# Patient Record
Sex: Female | Born: 1970 | Race: White | Hispanic: No | Marital: Married | Smoking: Never smoker
Health system: Southern US, Community
[De-identification: ages and names within clinical notes are randomized; demographics above are authoritative.]

## PROBLEM LIST (undated history)

## (undated) DIAGNOSIS — O24419 Gestational diabetes mellitus in pregnancy, unspecified control: Secondary | ICD-10-CM

## (undated) DIAGNOSIS — IMO0002 Reserved for concepts with insufficient information to code with codable children: Secondary | ICD-10-CM

## (undated) DIAGNOSIS — R87619 Unspecified abnormal cytological findings in specimens from cervix uteri: Secondary | ICD-10-CM

## (undated) HISTORY — DX: Reserved for concepts with insufficient information to code with codable children: IMO0002

## (undated) HISTORY — DX: Gestational diabetes mellitus in pregnancy, unspecified control: O24.419

## (undated) HISTORY — DX: Unspecified abnormal cytological findings in specimens from cervix uteri: R87.619

## (undated) HISTORY — PX: WISDOM TOOTH EXTRACTION: SHX21

---

## 1999-11-08 ENCOUNTER — Other Ambulatory Visit: Admission: RE | Admit: 1999-11-08 | Discharge: 1999-11-08 | Payer: Self-pay | Admitting: *Deleted

## 1999-12-05 ENCOUNTER — Other Ambulatory Visit: Admission: RE | Admit: 1999-12-05 | Discharge: 1999-12-05 | Payer: Self-pay | Admitting: *Deleted

## 2001-01-28 ENCOUNTER — Other Ambulatory Visit: Admission: RE | Admit: 2001-01-28 | Discharge: 2001-01-28 | Payer: Self-pay | Admitting: Obstetrics and Gynecology

## 2001-06-12 ENCOUNTER — Ambulatory Visit (HOSPITAL_COMMUNITY): Admission: RE | Admit: 2001-06-12 | Discharge: 2001-06-12 | Payer: Self-pay | Admitting: Obstetrics and Gynecology

## 2001-06-12 ENCOUNTER — Encounter: Payer: Self-pay | Admitting: Obstetrics and Gynecology

## 2001-09-11 ENCOUNTER — Inpatient Hospital Stay (HOSPITAL_COMMUNITY): Admission: AD | Admit: 2001-09-11 | Discharge: 2001-09-14 | Payer: Self-pay | Admitting: Obstetrics and Gynecology

## 2002-03-17 ENCOUNTER — Other Ambulatory Visit: Admission: RE | Admit: 2002-03-17 | Discharge: 2002-03-17 | Payer: Self-pay | Admitting: Obstetrics and Gynecology

## 2006-02-21 ENCOUNTER — Other Ambulatory Visit: Admission: RE | Admit: 2006-02-21 | Discharge: 2006-02-21 | Payer: Self-pay | Admitting: Obstetrics and Gynecology

## 2012-01-18 ENCOUNTER — Ambulatory Visit: Payer: Self-pay | Admitting: Obstetrics and Gynecology

## 2012-01-21 ENCOUNTER — Other Ambulatory Visit: Payer: Self-pay | Admitting: Obstetrics and Gynecology

## 2012-01-21 DIAGNOSIS — Z1231 Encounter for screening mammogram for malignant neoplasm of breast: Secondary | ICD-10-CM

## 2012-02-28 ENCOUNTER — Ambulatory Visit
Admission: RE | Admit: 2012-02-28 | Discharge: 2012-02-28 | Disposition: A | Discharge status: Home / Self Care | Payer: BC Managed Care – PPO | Source: Ambulatory Visit | Attending: Obstetrics and Gynecology | Admitting: Obstetrics and Gynecology

## 2012-02-28 DIAGNOSIS — Z1231 Encounter for screening mammogram for malignant neoplasm of breast: Secondary | ICD-10-CM

## 2012-03-05 ENCOUNTER — Encounter: Payer: Self-pay | Admitting: Obstetrics and Gynecology

## 2012-03-05 NOTE — Progress Notes (Signed)
Quick Note:  Please send "Dense breast" letter to patient and document in chart when letter is sent. ______ 

## 2012-03-13 ENCOUNTER — Encounter: Payer: Self-pay | Admitting: Obstetrics and Gynecology

## 2012-03-13 ENCOUNTER — Ambulatory Visit (INDEPENDENT_AMBULATORY_CARE_PROVIDER_SITE_OTHER): Payer: BC Managed Care – PPO | Admitting: Obstetrics and Gynecology

## 2012-03-13 VITALS — BP 112/70 | Ht 66.0 in | Wt 138.0 lb

## 2012-03-13 DIAGNOSIS — F329 Major depressive disorder, single episode, unspecified: Secondary | ICD-10-CM

## 2012-03-13 DIAGNOSIS — O24419 Gestational diabetes mellitus in pregnancy, unspecified control: Secondary | ICD-10-CM | POA: Insufficient documentation

## 2012-03-13 DIAGNOSIS — Z124 Encounter for screening for malignant neoplasm of cervix: Secondary | ICD-10-CM

## 2012-03-13 DIAGNOSIS — F3289 Other specified depressive episodes: Secondary | ICD-10-CM

## 2012-03-13 DIAGNOSIS — Z01419 Encounter for gynecological examination (general) (routine) without abnormal findings: Secondary | ICD-10-CM

## 2012-03-13 DIAGNOSIS — F32A Depression, unspecified: Secondary | ICD-10-CM

## 2012-03-13 DIAGNOSIS — IMO0002 Reserved for concepts with insufficient information to code with codable children: Secondary | ICD-10-CM | POA: Insufficient documentation

## 2012-03-13 MED ORDER — SERTRALINE HCL 50 MG PO TABS: 50.0000 mg | ORAL_TABLET | Freq: Every day | ORAL | Status: DC

## 2012-03-13 NOTE — Progress Notes (Signed)
The patient reports:no complaints Contraception:Mirena x 03/2007  Last mammogram: 02/2012 Normal Last pap: 01/17/2011 Normal  GC/Chlamydia cultures offered: declined HIV/RPR/HbsAg offered:  declined HSV 1 and 2 glycoprotein offered: declined  Menstrual cycle regular and monthly: Yes  Menstrual flow normal: No:   Urinary symptoms: none Normal bowel movements: Yes Reports abuse at home: No:   Subjective:    Brittany Obrien is a 41 y.o. female, No obstetric history on file., who presents for an annual exam.   Depression symptoms: agitation, anxiety, crying spells, irritability and sadness Homicidal thoughts: no Suicidal thoughts: no   Suggest starting antidepressants. Reviewed mechanism of action, expected benefits, time to benefits, possible side effects and possible need for trial and error. Also reviewed treatment duration of 6-12 months before considering weaning off medication. Referral to psychiatrist offered but declined at this time. Patient instructed to call office if symptoms worsen or suicidal / homicidal thoughts occur.Patient agreeable. 25 minutes spent discussing depression and treatment        History   Social History  . Marital Status: Married    Spouse Name: N/A    Number of Children: N/A  . Years of Education: N/A   Social History Main Topics  . Smoking status: Not on file  . Smokeless tobacco: Not on file  . Alcohol Use: Not on file  . Drug Use: Not on file  . Sexually Active: Not on file   Other Topics Concern  . Not on file   Social History Narrative  . No narrative on file    Menstrual cycle:   LMP: No LMP recorded. Patient is not currently having periods (Reason: IUD).           Cycle: very light  The following portions of the patient's history were reviewed and updated as appropriate: allergies, current medications, past family history, past medical history, past social history, past surgical history and problem list.  Review of  Systems Pertinent items are noted in HPI. Breast:Negative for breast lump,nipple discharge or nipple retraction Gastrointestinal: Negative for abdominal pain, change in bowel habits or rectal bleeding Urinary:negative   Objective:    There were no vitals taken for this visit.    Weight:  Wt Readings from Last 1 Encounters:  No data found for Wt          BMI: There is no height or weight on file to calculate BMI.  General Appearance: Alert, appropriate appearance for age. No acute distress HEENT: Grossly normal Neck / Thyroid: Supple, no masses, nodes or enlargement Lungs: clear to auscultation bilaterally Back: No CVA tenderness Breast Exam: No masses or nodes.No dimpling, nipple retraction or discharge. Cardiovascular: Regular rate and rhythm. S1, S2, no murmur Gastrointestinal: Soft, non-tender, no masses or organomegaly Pelvic Exam: Vulva and vagina appear normal. Bimanual exam reveals normal uterus and adnexa.Strings + Rectovaginal: normal rectal, no masses Lymphatic Exam: Non-palpable nodes in neck, clavicular, axillary, or inguinal regions Skin: no rash or abnormalities Neurologic: Normal gait and speech, no tremor  Psychiatric: Alert and oriented, appropriate affect.   Assessment:    Normal gyn exam Contraceptive management    Plan:    mammogram pap smear return annually or prn STD screening: declined Contraception:IUD Dense Breast Discussed  Depression Discussed. C/o feeling overwhelmed and stressed. Antidepressants discussed  Zoloft 50 mg Given / Will discussed results @ Mirena Insertion  Mirena was reviewed with the patient With expected benefits of: lack of estrogen,5 year duration, high reliability at 99.9%, reversibility, reduction or cessation  of menstrual flow and improvement or resolution of dysmenorrhea/pelvic pain. Risks at the time of insertion were reviewed: dysfunctional uterine bleeding lasting up to 6 months, infection and uterine perforation which  may require laparoscopy for retrieval.  Instructions for day of insertion discussed:  yes avoidance of unprotected intercourse 2 weeks prior, best to schedule during a menstrual cycle and use of Ibuprofen 600 mg 1 hour before appointment.  GC/Chlamydia was collected at this visit: yes    Silverio Lay MD

## 2012-03-14 LAB — PAP IG, CT-NG, RFX HPV ASCU
Chlamydia Probe Amp: NEGATIVE
GC Probe Amp: NEGATIVE

## 2012-04-09 ENCOUNTER — Encounter: Payer: BC Managed Care – PPO | Admitting: Obstetrics and Gynecology

## 2012-04-23 ENCOUNTER — Encounter: Payer: Self-pay | Admitting: Obstetrics and Gynecology

## 2012-04-23 ENCOUNTER — Ambulatory Visit: Payer: BC Managed Care – PPO | Admitting: Obstetrics and Gynecology

## 2012-04-23 VITALS — BP 98/60 | Ht 66.0 in | Wt 139.0 lb

## 2012-04-23 DIAGNOSIS — F32A Depression, unspecified: Secondary | ICD-10-CM

## 2012-04-23 DIAGNOSIS — F329 Major depressive disorder, single episode, unspecified: Secondary | ICD-10-CM

## 2012-04-23 DIAGNOSIS — Z975 Presence of (intrauterine) contraceptive device: Secondary | ICD-10-CM

## 2012-04-23 DIAGNOSIS — Z30430 Encounter for insertion of intrauterine contraceptive device: Secondary | ICD-10-CM

## 2012-04-23 LAB — POCT URINE PREGNANCY: Preg Test, Ur: NEGATIVE

## 2012-04-23 MED ORDER — SERTRALINE HCL 100 MG PO TABS: 100.0000 mg | ORAL_TABLET | Freq: Every day | ORAL | Status: DC

## 2012-04-23 MED ORDER — LEVONORGESTREL 20 MCG/24HR IU IUD
INTRAUTERINE_SYSTEM | Freq: Once | INTRAUTERINE | Status: AC
  Administered 2012-04-23: 11:00:00 via INTRAUTERINE

## 2012-04-23 NOTE — Progress Notes (Signed)
IUD INSERTION NOTE   Consent signed after risks and benefits were reviewed including but not limited to bleeding, infection and risk of uterine perforation.  LMP: No LMP recorded. Patient is not currently having periods (Reason: IUD). UPT: Negative GC / Chlamydia: negative   Prepping with Betadine Tenaculum placed on anterior lip of cervix Uterus sounded at  7.5 cm Insertion of MIRENA IUD per protocol without any complications. Needed cervical os finder.  POST-PROCEDURE:  Patient instructed to call with fever or excessive pain Patient instructed to check IUD strings after each menstrual cycle   Follow-up: 2 weeks   Silverio Lay MD 04/23/2012 9:39 AM

## 2012-11-27 ENCOUNTER — Encounter: Payer: Self-pay | Admitting: Family Medicine

## 2012-11-27 ENCOUNTER — Ambulatory Visit (INDEPENDENT_AMBULATORY_CARE_PROVIDER_SITE_OTHER): Payer: BC Managed Care – PPO | Admitting: Family Medicine

## 2012-11-27 ENCOUNTER — Ambulatory Visit: Payer: BC Managed Care – PPO | Admitting: Family Medicine

## 2012-11-27 VITALS — BP 108/79 | HR 67 | Ht 66.0 in | Wt 135.0 lb

## 2012-11-27 DIAGNOSIS — M545 Low back pain, unspecified: Secondary | ICD-10-CM

## 2012-11-27 NOTE — Patient Instructions (Addendum)
You have strained your piriformis and one of the posture muscles of your low back. Take ibuprofen 600mg  three times a day with food OR aleve 2 tabs twice a day with food for pain and inflammation for 1 week then as needed. I don't think prednisone will help you with this issue. Consider Percocet as needed for severe pain (no driving on this medicine). Consider Flexeril as needed for muscle spasms (no driving on this medicine if it makes you sleepy). Stay as active as possible. Physical therapy has been shown to be helpful as well. Do home exercises every day for next 4-6 weeks (these are the most important part of your treatment. Pick 2-3 exercises from each handout.  Hold stretches for 20-30 seconds, repeat 3 times once a day.   Cross train with cycling, walking for now until pain is a 1-2/10 then ease back into sports, running. Strengthening of low back muscles, abdominal musculature are key for long term pain relief. Follow up with me in 1 month or as needed.

## 2012-11-28 ENCOUNTER — Encounter: Payer: Self-pay | Admitting: Family Medicine

## 2012-11-28 ENCOUNTER — Ambulatory Visit: Payer: BC Managed Care – PPO | Admitting: Family Medicine

## 2012-11-28 DIAGNOSIS — M545 Low back pain, unspecified: Secondary | ICD-10-CM | POA: Insufficient documentation

## 2012-11-28 NOTE — Progress Notes (Signed)
Patient ID: Brittany Obrien, female   DOB: [birth year], 42 y.o.   MRN: [id number]  PCP: Pcp Not In System  Subjective:   HPI: Patient is a 42 y.o. female here for low back pain.  Patient reports no prior low back problems. She had some soreness in left glut/back about 2 weeks ago after playing a lot of sports, volleyball. Then yeserday was playing tennis and during what she thinks is a backhand she felt her left side jam up and exacerbate prior area of injury. Tried ibuprofen, tylenol, icing, heating pad. Hard to bend over, sit for a long time, sleep. Took one of her husbands percocet to help her sleep. No radiation into legs. No numbness/tingling. No bowel/bladder dysfunction.  Past Medical History  Diagnosis Date  . Abnormal Pap smear   . Gestational diabetes mellitus in pregnancy     Current Outpatient Prescriptions on File Prior to Visit  Medication Sig Dispense Refill  . levonorgestrel (MIRENA) 20 MCG/24HR IUD 1 each by Intrauterine route once.      . sertraline (ZOLOFT) 100 MG tablet Take 1 tablet (100 mg total) by mouth daily.  30 tablet  11  . sertraline (ZOLOFT) 50 MG tablet Take 1 tablet (50 mg total) by mouth daily.  30 tablet  11   No current facility-administered medications on file prior to visit.    Past Surgical History  Procedure Laterality Date  . Wisdom tooth extraction      No Known Allergies  History   Social History  . Marital Status: Married    Spouse Name: N/A    Number of Children: N/A  . Years of Education: N/A   Occupational History  . Not on file.   Social History Main Topics  . Smoking status: Never Smoker   . Smokeless tobacco: Never Used  . Alcohol Use: No  . Drug Use: No  . Sexual Activity: Yes    Birth Control/ Protection: IUD   Other Topics Concern  . Not on file   Social History Narrative  . No narrative on file    Family History  Problem Relation Age of Onset  . Hypertension Father     BP 108/79  Pulse 67  Ht 5'  6" (1.676 m)  Wt 135 lb (61.236 kg)  BMI 21.8 kg/m2  Review of Systems: See HPI above.    Objective:  Physical Exam:  Gen: NAD  Back: No gross deformity, scoliosis. TTP mildly left piriformis.  No other TTP including SI joint, trochanter, No midline or bony TTP. FROM with pain on flexion in left side low back and buttock. Strength LEs 5/5 all muscle groups.   2+ MSRs in patellar and achilles tendons, equal bilaterally. Negative SLRs. Sensation intact to light touch bilaterally. Negative logroll bilateral hips Positive piriformis on left, negative right.  Negative fabers bilaterally.    Assessment & Plan:  1. Low back pain - consistent with piriformis and low back strains.  Regular nsaids.  Start home exercises and stretches - handouts given.  Cross train for next couple weeks until pain is minimal.  Declined percocet, flexeril for now.  Consider formal PT if not improving.  F/u in 1 month.

## 2012-11-28 NOTE — Assessment & Plan Note (Signed)
consistent with piriformis and low back strains.  Regular nsaids.  Start home exercises and stretches - handouts given.  Cross train for next couple weeks until pain is minimal.  Declined percocet, flexeril for now.  Consider formal PT if not improving.  F/u in 1 month.

## 2014-01-25 ENCOUNTER — Encounter: Payer: Self-pay | Admitting: Family Medicine

## 2014-06-03 ENCOUNTER — Other Ambulatory Visit: Payer: Self-pay

## 2014-06-03 DIAGNOSIS — Z1231 Encounter for screening mammogram for malignant neoplasm of breast: Secondary | ICD-10-CM

## 2014-06-17 ENCOUNTER — Ambulatory Visit
Admission: RE | Admit: 2014-06-17 | Discharge: 2014-06-17 | Disposition: A | Discharge status: Home / Self Care | Payer: BC Managed Care – PPO | Source: Ambulatory Visit

## 2014-06-17 DIAGNOSIS — Z1231 Encounter for screening mammogram for malignant neoplasm of breast: Secondary | ICD-10-CM

## 2014-12-29 ENCOUNTER — Ambulatory Visit (INDEPENDENT_AMBULATORY_CARE_PROVIDER_SITE_OTHER): Payer: BC Managed Care – PPO | Admitting: Urgent Care

## 2014-12-29 VITALS — BP 102/68 | HR 76 | Temp 98.3°F | Resp 18 | Ht 66.0 in | Wt 134.0 lb

## 2014-12-29 DIAGNOSIS — Z111 Encounter for screening for respiratory tuberculosis: Secondary | ICD-10-CM | POA: Diagnosis not present

## 2014-12-29 DIAGNOSIS — Z0000 Encounter for general adult medical examination without abnormal findings: Secondary | ICD-10-CM | POA: Diagnosis not present

## 2014-12-29 NOTE — Progress Notes (Signed)
    MRN: [id number] DOB: April 26, 1970  Subjective:   Brittany Obrien is a 44 y.o. 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 She needs tuberculosis screen and prefers blood test to PPD, needs teacher physical, would like to have labs drawn. Denies any other aggravating or relieving factors, no other questions or concerns.  Brittany Obrien has a current medication list which includes the following prescription(s): levonorgestrel. Also has No Known Allergies.  Brittany Obrien  has a past medical history of Abnormal Pap smear and Gestational diabetes mellitus in pregnancy. Also  has past surgical history that includes Wisdom tooth extraction.  Objective:   Vitals: BP 102/68 mmHg  Pulse 76  Temp(Src) 98.3 F (36.8 C) (Oral)  Resp 18  Ht  (1.676 m)  Wt 134 lb (60.782 kg)  BMI 21.64 kg/m2  SpO2 95%  Physical Exam  Constitutional: She is oriented to person, place, and time. She appears well-developed and well-nourished.  HENT:  TM's intact bilaterally, no effusions or erythema. Nares patent, nasal turbinates pink and moist, nasal passages patent. No sinus tenderness. Oropharynx clear, mucous membranes moist, dentition in good repair.  Eyes: Conjunctivae and EOM are normal. Pupils are equal, round, and reactive to light. No scleral icterus.  Neck: Normal range of motion. Neck supple. No thyromegaly present.  Cardiovascular: Normal rate, regular rhythm and intact distal pulses.  Exam reveals no gallop and no friction rub.   No murmur heard. Pulmonary/Chest: No respiratory distress. She has no wheezes. She has no rales.  Musculoskeletal: She exhibits no edema.  Full ROM. Strength 5/5.  Lymphadenopathy:    She has no cervical adenopathy.  Neurological: She is alert and oriented to person, place, and time. She has normal reflexes.  Skin: Skin is warm and dry. No rash noted. No erythema. No pallor.    Psychiatric: She has a normal mood and affect.   Assessment and Plan :   1. Annual physical exam 2. Screening for tuberculosis - Will complete form pending tuberculosis screen. 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 Follow up by school as needed for immunizations.  [name], PA-C [facility] [phone] 12/29/2014 10:05 PM

## 2014-12-29 NOTE — Patient Instructions (Signed)

## 2014-12-29 NOTE — Progress Notes (Signed)

## 2014-12-30 LAB — CBC
HCT: 37.8 % (ref 36.0–46.0)
HEMOGLOBIN: 12.4 g/dL (ref 12.0–15.0)
MCH: 30.7 pg (ref 26.0–34.0)
MCHC: 32.8 g/dL (ref 30.0–36.0)
MCV: 93.6 fL (ref 78.0–100.0)
MPV: 10 fL (ref 8.6–12.4)
Platelets: 264 10*3/uL (ref 150–400)
RBC: 4.04 MIL/uL (ref 3.87–5.11)
RDW: 13.4 % (ref 11.5–15.5)
WBC: 3.5 10*3/uL — AB (ref 4.0–10.5)

## 2014-12-30 LAB — LIPID PANEL
CHOLESTEROL: 168 mg/dL (ref 125–200)
HDL: 54 mg/dL (ref >=46)
LDL CALC: 99 mg/dL (ref <130)
TRIGLYCERIDES: 73 mg/dL (ref <150)
Total CHOL/HDL Ratio: 3.1 Ratio (ref <=5.0)
VLDL: 15 mg/dL (ref <30)

## 2014-12-30 LAB — COMPREHENSIVE METABOLIC PANEL
ALBUMIN: 4.8 g/dL (ref 3.6–5.1)
ALT: 9 U/L (ref 6–29)
AST: 14 U/L (ref 10–30)
Alkaline Phosphatase: 40 U/L (ref 33–115)
BUN: 11 mg/dL (ref 7–25)
CO2: 27 mmol/L (ref 20–31)
CREATININE: 0.75 mg/dL (ref 0.50–1.10)
Calcium: 8.9 mg/dL (ref 8.6–10.2)
Chloride: 102 mmol/L (ref 98–110)
Glucose, Bld: 76 mg/dL (ref 65–99)
Potassium: 4.1 mmol/L (ref 3.5–5.3)
SODIUM: 139 mmol/L (ref 135–146)
TOTAL PROTEIN: 7.3 g/dL (ref 6.1–8.1)
Total Bilirubin: 0.5 mg/dL (ref 0.2–1.2)

## 2014-12-30 LAB — QUANTIFERON TB GOLD ASSAY (BLOOD)
Interferon Gamma Release Assay: NEGATIVE
Mitogen value: >10 IU/mL
QUANTIFERON TB AG MINUS NIL: 0 [IU]/mL
Quantiferon Nil Value: 0.02 IU/mL
TB AG VALUE: 0.02 [IU]/mL

## 2014-12-30 LAB — TSH: TSH: 2.953 u[IU]/mL (ref 0.350–4.500)

## 2016-12-14 IMAGING — MG MM DIGITAL SCREENING BILAT W/ CAD
4 series · 4 of 4 positions shown · non-contrast
Comparison: Previous exam(s).

CLINICAL DATA: Screening.

EXAM:
DIGITAL SCREENING BILATERAL MAMMOGRAM WITH CAD

[R CC]
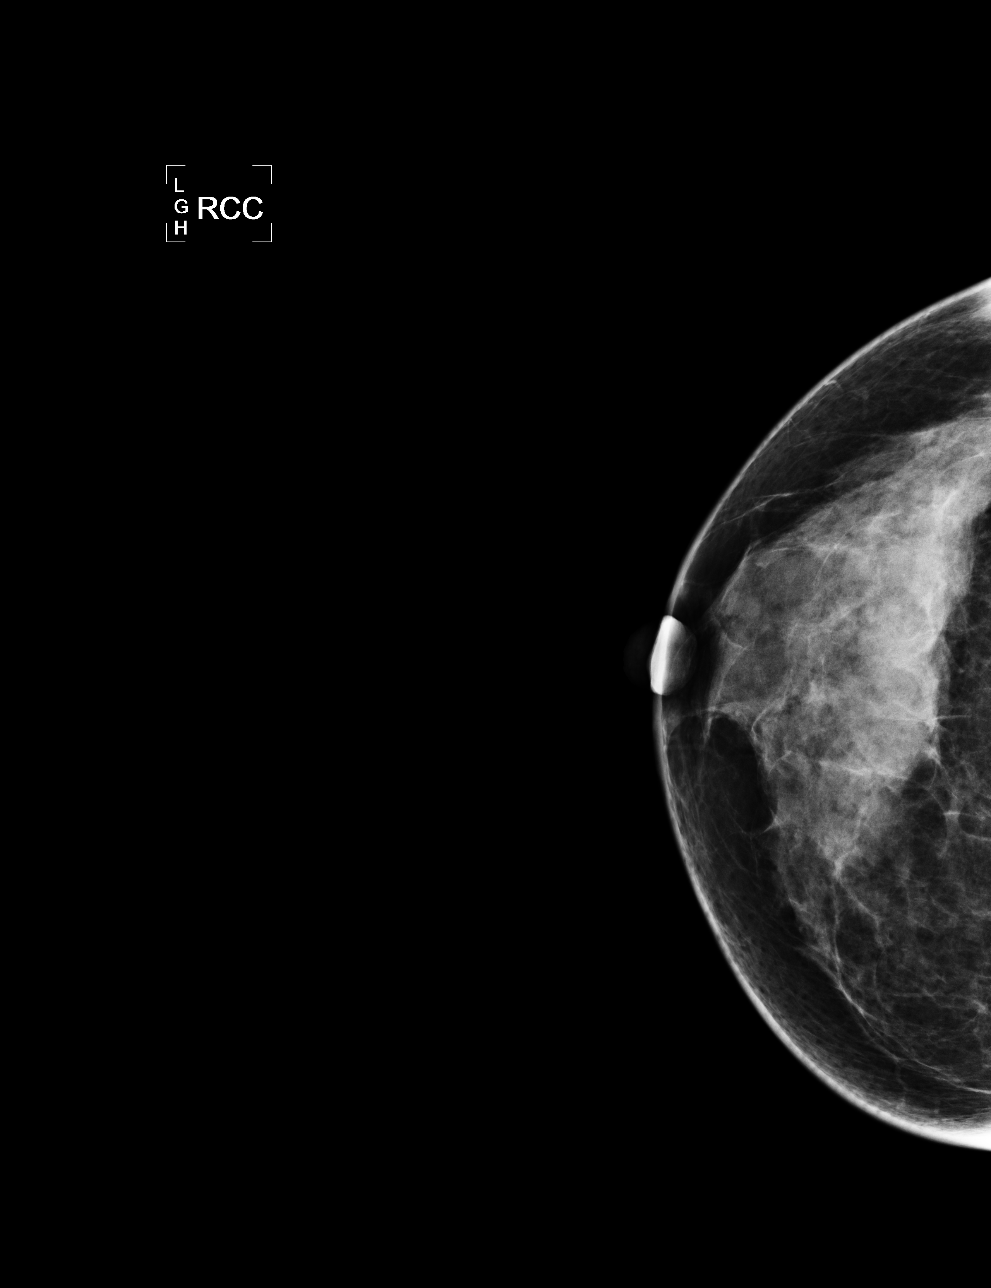

[L CC]
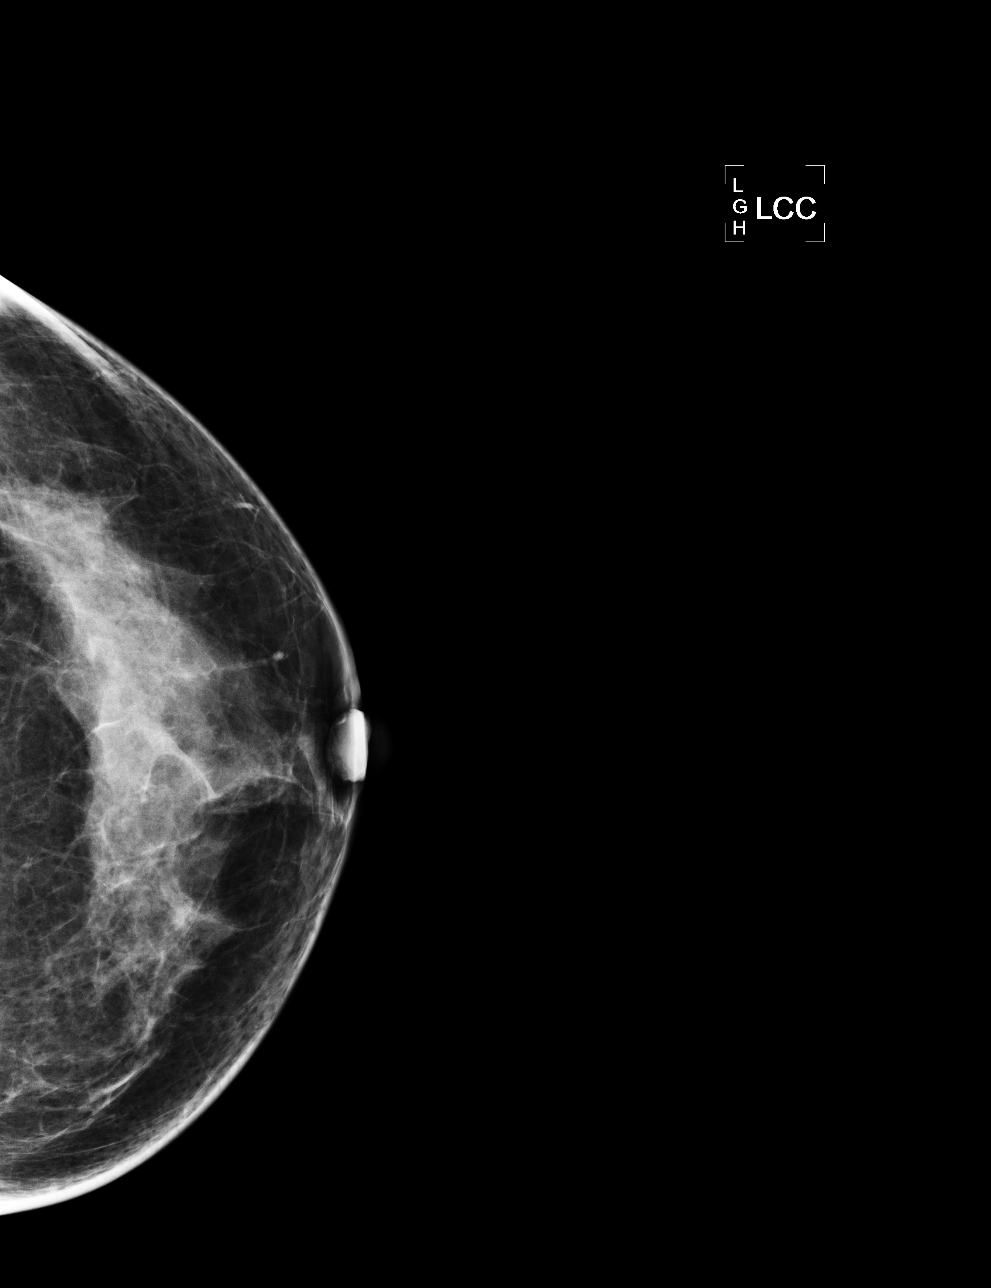

[L MLO]
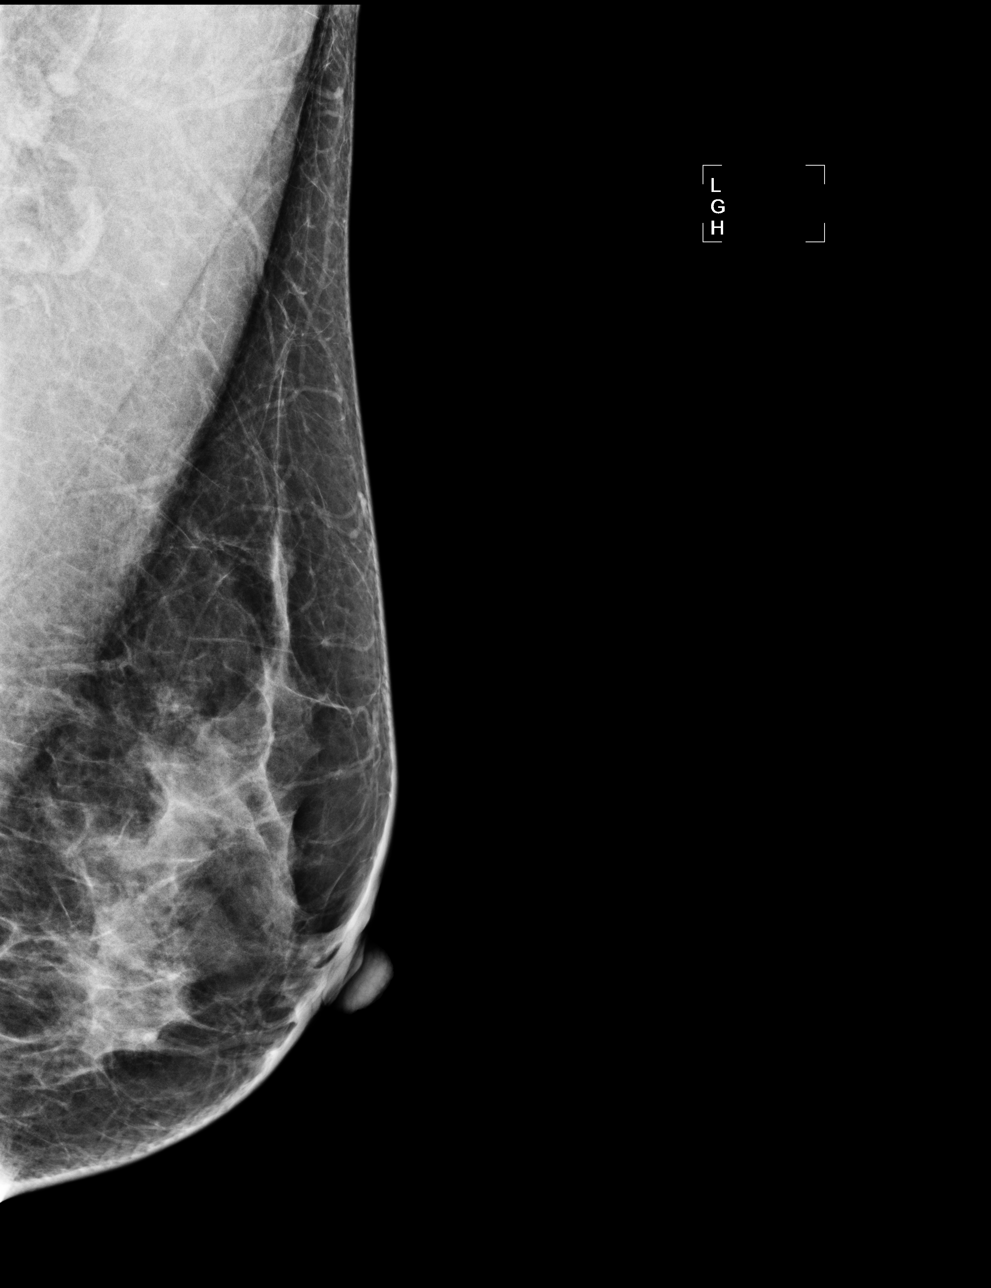

[R MLO]
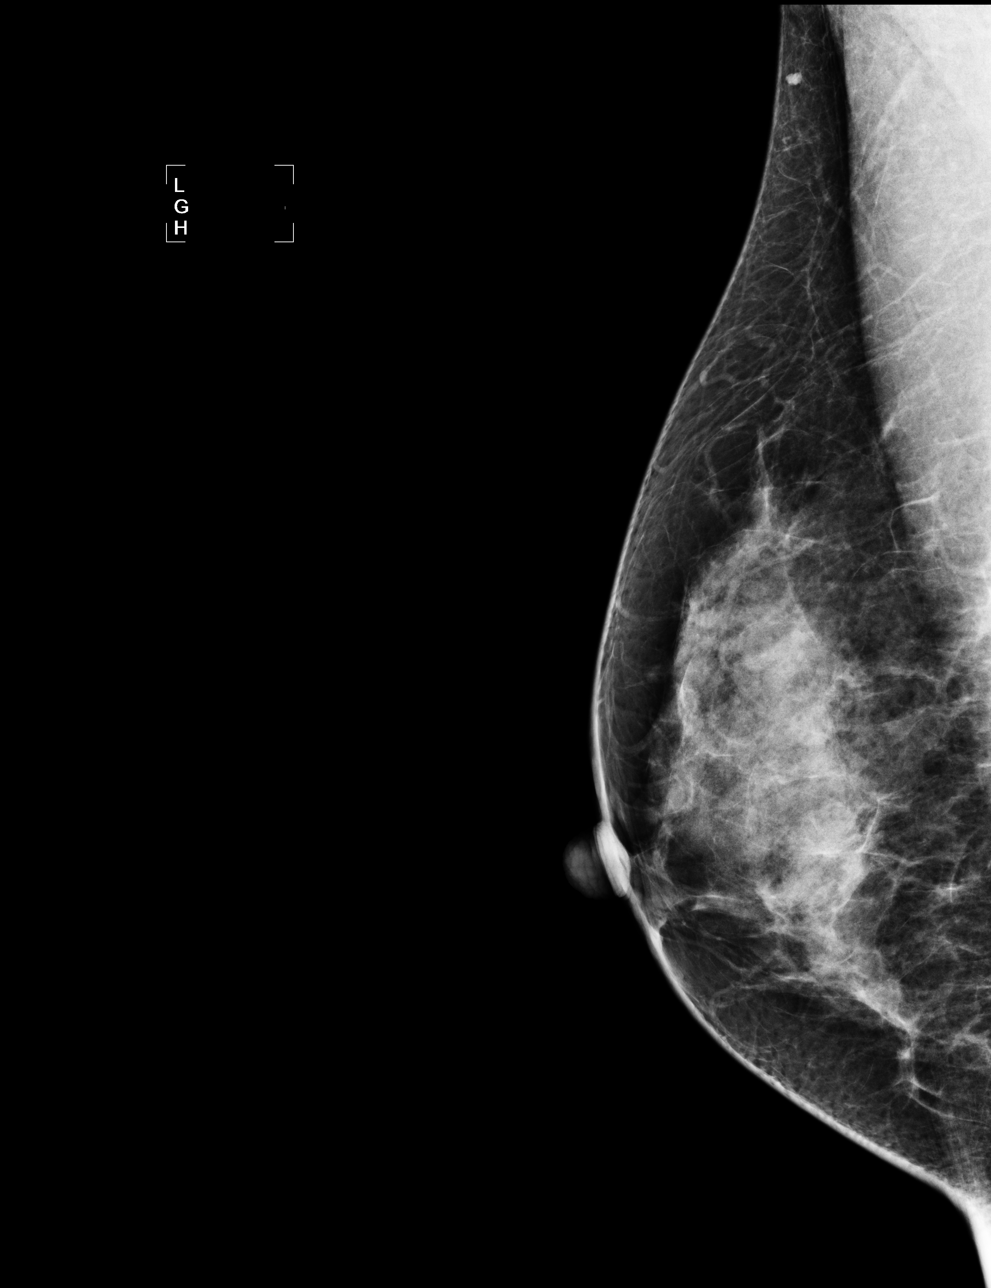

[4 of 4 positions shown; findings below may reference images not displayed]

ACR Breast Density Category c: The breast tissue is heterogeneously
dense, which may obscure small masses.
FINDINGS: There are no findings suspicious for malignancy. Images were
processed with CAD.
IMPRESSION: No mammographic evidence of malignancy. A result letter of this
screening mammogram will be mailed directly to the patient.

RECOMMENDATION:
Screening mammogram in one year. (Code:YJ-2-FEZ)

BI-RADS CATEGORY  1: Negative.

## 2019-08-20 ENCOUNTER — Other Ambulatory Visit: Payer: Self-pay

## 2019-08-20 ENCOUNTER — Encounter (INDEPENDENT_AMBULATORY_CARE_PROVIDER_SITE_OTHER): Payer: Self-pay | Admitting: Internal Medicine

## 2019-08-20 ENCOUNTER — Ambulatory Visit (INDEPENDENT_AMBULATORY_CARE_PROVIDER_SITE_OTHER): Payer: BC Managed Care – PPO | Admitting: Internal Medicine

## 2019-08-20 VITALS — BP 100/65 | HR 63 | Temp 98.1°F | Ht 66.0 in | Wt 141.4 lb

## 2019-08-20 DIAGNOSIS — Z0000 Encounter for general adult medical examination without abnormal findings: Secondary | ICD-10-CM | POA: Diagnosis not present

## 2019-08-20 DIAGNOSIS — F520 Hypoactive sexual desire disorder: Secondary | ICD-10-CM

## 2019-08-20 DIAGNOSIS — E559 Vitamin D deficiency, unspecified: Secondary | ICD-10-CM

## 2019-08-20 DIAGNOSIS — R5383 Other fatigue: Secondary | ICD-10-CM

## 2019-08-20 DIAGNOSIS — R5381 Other malaise: Secondary | ICD-10-CM

## 2019-08-20 DIAGNOSIS — R232 Flushing: Secondary | ICD-10-CM

## 2019-08-20 NOTE — Progress Notes (Signed)
Chief Complaint: This 49 year old lady comes in as a new patient to establish care, received an annual physical exam and address some symptoms she is having. HPI: She describes fatigue which has been present for at least a year.  She describes aches and generalized pains across her body.  She describes brain fog and difficulty with focus and concentration although this may be related to other events/situations in her life. She describes hot flashes present for at least a year.  She is not having any cycles at the present time because she has a Mirena IUD  for several years. She describes virtually no sex drive/libido for the last 1 year. She also describes cold intolerance and dry skin.  Past Medical History:  Diagnosis Date  . Abnormal Pap smear   . Gestational diabetes mellitus in pregnancy    Past Surgical History:  Procedure Laterality Date  . 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    Social History   Tobacco Use  . Smoking status: Never Smoker  . Smokeless tobacco: Never Used  Substance Use Topics  . Alcohol use: No    Comment: very rare      Allergies: No Known Allergies   Current Meds  Medication Sig  . levonorgestrel (MIRENA) 20 MCG/24HR IUD 1 each by Intrauterine route once.  . vortioxetine HBr (TRINTELLIX) 20 MG TABS tablet Take 10 mg by mouth daily.     Depression screen [facility] 2/9 08/20/2019 12/29/2014  Decreased Interest 0 0  Down, Depressed, Hopeless 0 0  PHQ - 2 Score 0 0     CXK:GYJEH from the symptoms mentioned above,there are no other symptoms referable to all systems reviewed.       Physical Exam: Blood pressure 100/65, pulse 63, temperature 98.1 F (36.7 C), temperature source Temporal, height 5\' 6"  (1.676 m), weight 141 lb 6.4 oz (64.1 kg), SpO2 95 %. Vitals with BMI 08/20/2019 12/29/2014 11/27/2012  Height 5\' 6"  5\' 6"  5\' 6"     Weight 141 lbs 6 oz 134 lbs 135 lbs  BMI 22.83 21.7 21.8  Systolic 100 102 01/27/2013  Diastolic 65 68 79  Pulse 63 76 67      She looks systemically well. General: Alert, cooperative, and appears to be the stated age.No pallor.  No jaundice.  No clubbing. Head: Normocephalic Eyes: Sclera white, pupils equal and reactive to light, red reflex x 2,  Ears: Normal bilaterally Oral cavity: Lips, mucosa, and tongue normal: Teeth and gums normal Neck: No adenopathy, supple, symmetrical, trachea midline, and thyroid does not appear enlarged. Breast: No masses felt. Respiratory: Clear to auscultation bilaterally.No wheezing, crackles or bronchial breathing. Cardiovascular: Heart sounds are present and appear to be normal without murmurs or added sounds.  No carotid bruits.  Peripheral pulses are present and equal bilaterally.: Gastrointestinal:positive bowel sounds, no hepatosplenomegaly.  No masses felt.No tenderness. Skin: Clear, No rashes noted.No worrisome skin lesions seen. Neurological: Grossly intact without focal findings, cranial nerves II through XII intact, muscle strength equal bilaterally Musculoskeletal: No acute joint abnormalities noted.Full range of movement noted with joints. Psychiatric: Affect appropriate, non-anxious.    Assessment  1. Routine general medical examination at a health care facility   2. Hypoactive sexual desire disorder   3. Malaise and fatigue   4. Vitamin D deficiency disease   5. Hot flashes     Tests Ordered:  Orders Placed This Encounter  Procedures  . CBC  . COMPLETE METABOLIC PANEL WITH GFR  . Progesterone  . Estradiol  . Follicle stimulating hormone  . Luteinizing hormone  . T3, free  . T4  . TSH  . Testos,Total,Free and SHBG (Female)  . VITAMIN D 25 Hydroxy (Vit-D Deficiency, Fractures)     Plan  1. Above blood work is ordered. 2. I discussed the philosophy of the practice based on nutrition, exercise and identical hormone  therapy. 3. I will see her in the next few weeks to discuss her results and further recommendations.     No orders of the defined types were placed in this encounter.    Saanvika Vazques C Zenith Lamphier   08/20/2019, 2:15 PM

## 2019-08-20 NOTE — Progress Notes (Signed)
Metrics: Intervention Frequency ACO  Documented Smoking Status Yearly  Screened one or more times in 24 months  Cessation Counseling or  Active cessation medication Past 24 months  Past 24 months   Guideline developer: UpToDate (See UpToDate for funding source) Date Released: 2014

## 2019-09-07 LAB — CBC
HCT: 37.3 % (ref 35.0–45.0)
Hemoglobin: 12.4 g/dL (ref 11.7–15.5)
MCH: 30.7 pg (ref 27.0–33.0)
MCHC: 33.2 g/dL (ref 32.0–36.0)
MCV: 92.3 fL (ref 80.0–100.0)
MPV: 10.7 fL (ref 7.5–12.5)
Platelets: 230 10*3/uL (ref 140–400)
RBC: 4.04 10*6/uL (ref 3.80–5.10)
RDW: 12.1 % (ref 11.0–15.0)
WBC: 4.6 10*3/uL (ref 3.8–10.8)

## 2019-09-07 LAB — PROGESTERONE: Progesterone: <0.5 ng/mL

## 2019-09-07 LAB — T3, FREE: T3, Free: 3.2 pg/mL (ref 2.3–4.2)

## 2019-09-07 LAB — COMPLETE METABOLIC PANEL WITH GFR
AG Ratio: 1.8 (calc) (ref 1.0–2.5)
ALT: 9 U/L (ref 6–29)
AST: 12 U/L (ref 10–35)
Albumin: 4.6 g/dL (ref 3.6–5.1)
Alkaline phosphatase (APISO): 42 U/L (ref 31–125)
BUN: 13 mg/dL (ref 7–25)
CO2: 28 mmol/L (ref 20–32)
Calcium: 9.8 mg/dL (ref 8.6–10.2)
Chloride: 104 mmol/L (ref 98–110)
Creat: 0.94 mg/dL (ref 0.50–1.10)
GFR, Est African American: 83 mL/min/{1.73_m2} (ref >=60)
GFR, Est Non African American: 72 mL/min/{1.73_m2} (ref >=60)
Globulin: 2.6 g/dL (calc) (ref 1.9–3.7)
Glucose, Bld: 108 mg/dL (ref 65–139)
Potassium: 3.9 mmol/L (ref 3.5–5.3)
Sodium: 140 mmol/L (ref 135–146)
Total Bilirubin: 0.4 mg/dL (ref 0.2–1.2)
Total Protein: 7.2 g/dL (ref 6.1–8.1)

## 2019-09-07 LAB — TESTOS,TOTAL,FREE AND SHBG (FEMALE)
Free Testosterone: 1.4 pg/mL (ref 0.1–6.4)
Sex Hormone Binding: 73 nmol/L (ref 17–124)
Testosterone, Total, LC-MS-MS: 18 ng/dL (ref 2–45)

## 2019-09-07 LAB — T4: T4, Total: 6.9 ug/dL (ref 5.1–11.9)

## 2019-09-07 LAB — FOLLICLE STIMULATING HORMONE: FSH: 160.2 m[IU]/mL — ABNORMAL HIGH

## 2019-09-07 LAB — VITAMIN D 25 HYDROXY (VIT D DEFICIENCY, FRACTURES): Vit D, 25-Hydroxy: 26 ng/mL — ABNORMAL LOW (ref 30–100)

## 2019-09-07 LAB — ESTRADIOL: Estradiol: 15 pg/mL

## 2019-09-07 LAB — TSH: TSH: 3.36 mIU/L

## 2019-09-07 LAB — LUTEINIZING HORMONE: LH: 80.8 m[IU]/mL — ABNORMAL HIGH

## 2019-09-29 ENCOUNTER — Ambulatory Visit (INDEPENDENT_AMBULATORY_CARE_PROVIDER_SITE_OTHER): Payer: BC Managed Care – PPO | Admitting: Internal Medicine

## 2019-09-29 ENCOUNTER — Other Ambulatory Visit: Payer: Self-pay

## 2019-09-29 ENCOUNTER — Encounter (INDEPENDENT_AMBULATORY_CARE_PROVIDER_SITE_OTHER): Payer: Self-pay | Admitting: Internal Medicine

## 2019-09-29 VITALS — BP 124/61 | HR 70 | Temp 97.5°F | Resp 18 | Ht 66.0 in | Wt 141.0 lb

## 2019-09-29 DIAGNOSIS — R232 Flushing: Secondary | ICD-10-CM

## 2019-09-29 DIAGNOSIS — F520 Hypoactive sexual desire disorder: Secondary | ICD-10-CM

## 2019-09-29 DIAGNOSIS — E559 Vitamin D deficiency, unspecified: Secondary | ICD-10-CM | POA: Diagnosis not present

## 2019-09-29 MED ORDER — PROGESTERONE MICRONIZED 100 MG PO CAPS
100.0000 mg | ORAL_CAPSULE | Freq: Every day | ORAL | 3 refills | Status: DC
Start: 2019-09-29 — End: 2019-12-23

## 2019-09-29 MED ORDER — ESTRADIOL 0.5 MG PO TABS
0.5000 mg | ORAL_TABLET | Freq: Every day | ORAL | 3 refills | Status: DC
Start: 2019-09-29 — End: 2019-12-31

## 2019-09-29 NOTE — Progress Notes (Signed)
Metrics: Intervention Frequency ACO  Documented Smoking Status Yearly  Screened one or more times in 24 months  Cessation Counseling or  Active cessation medication Past 24 months  Past 24 months   Guideline developer: UpToDate (See UpToDate for funding source) Date Released: 2014       Wellness Office Visit  Subjective:  Patient ID: Brittany Obrien, female    DOB: 05-28-1970  Age: 49 y.o. MRN: [id number]  CC: This lady comes in to discuss all her blood results and further recommendations. HPI  Her vitamin D levels are very low and she is vitamin D deficient.  Her hormones are consistent with menopause.  Her estradiol and progesterone levels are very low.  Testosterone levels are very low.  Remaining blood work is unremarkable.  I discussed all these numbers with her. Past Medical History:  Diagnosis Date  . Abnormal Pap smear   . Gestational diabetes mellitus in pregnancy    Past Surgical History:  Procedure Laterality Date  . WISDOM TOOTH EXTRACTION       Family History  Problem Relation Age of Onset  . Hypertension Father   . Hyperlipidemia Father   . Arthritis Mother   . Stroke Maternal Grandmother   . 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   Social History   Tobacco Use  . Smoking status: Never Smoker  . Smokeless tobacco: Never Used  Substance Use Topics  . Alcohol use: No    Comment: very rare    Current Meds  Medication Sig  . levonorgestrel (MIRENA) 20 MCG/24HR IUD 1 each by Intrauterine route once.  . vortioxetine HBr (TRINTELLIX) 20 MG TABS tablet Take 10 mg by mouth daily.      Depression screen [facility] 2/9 08/20/2019 12/29/2014  Decreased Interest 0 0  Down, Depressed, Hopeless 0 0  PHQ - 2 Score 0 0     Objective:   Today's Vitals: BP 124/61 (BP Location: Left Arm, Patient Position: Sitting, Cuff Size: Normal)   Pulse 70    Temp (!) 97.5 F (36.4 C) (Temporal)   Resp 18   Ht 5\' 6"  (1.676 m)   Wt 141 lb (64 kg)   SpO2 98%   BMI 22.76 kg/m  Vitals with BMI 09/29/2019 08/20/2019 12/29/2014  Height 5\' 6"  5\' 6"  5\' 6"   Weight 141 lbs 141 lbs 6 oz 134 lbs  BMI 22.77 22.83 21.7  Systolic 124 100 02/28/2015  Diastolic 61 65 68  Pulse 70 63 76     Physical Exam   She looks systemically well.  No new findings.    Assessment   1. Hot flashes   2. Hypoactive sexual desire disorder   3. Vitamin D deficiency disease       Tests ordered No orders of the defined types were placed in this encounter.    Plan: 1. I recommended she start taking vitamin D3 10,000 units daily. 2. I discussed her menopausal symptoms that she now is in the menopause.  She has seen a gynecologist and the gynecologist is recommended to take out the Mirena in January 2022.  I do not think she would need to have it reinserted.  Since she is in the menopause, she would benefit from bioidentical hormones and I discussed this with her including the women's health initiative study, benefits and side effects.  She is agreeable to try and I have  sent prescription for estradiol and progesterone. 3. I will see her in about a month's time for close follow-up and we will check levels then to.   Meds ordered this encounter  Medications  . estradiol (ESTRACE) 0.5 MG tablet    Sig: Take 1 tablet (0.5 mg total) by mouth daily.    Dispense:  30 tablet    Refill:  3  . progesterone (PROMETRIUM) 100 MG capsule    Sig: Take 1 capsule (100 mg total) by mouth daily.    Dispense:  30 capsule    Refill:  3    Deneene Tarver Normajean Glasgow, MD

## 2019-11-10 ENCOUNTER — Ambulatory Visit (INDEPENDENT_AMBULATORY_CARE_PROVIDER_SITE_OTHER): Payer: BC Managed Care – PPO | Admitting: Internal Medicine

## 2019-12-23 ENCOUNTER — Other Ambulatory Visit (INDEPENDENT_AMBULATORY_CARE_PROVIDER_SITE_OTHER): Payer: Self-pay | Admitting: Internal Medicine

## 2019-12-30 ENCOUNTER — Encounter (INDEPENDENT_AMBULATORY_CARE_PROVIDER_SITE_OTHER): Payer: Self-pay | Admitting: Internal Medicine

## 2019-12-30 ENCOUNTER — Ambulatory Visit (INDEPENDENT_AMBULATORY_CARE_PROVIDER_SITE_OTHER): Payer: BC Managed Care – PPO | Admitting: Internal Medicine

## 2019-12-30 ENCOUNTER — Other Ambulatory Visit: Payer: Self-pay

## 2019-12-30 VITALS — BP 121/67 | HR 59 | Temp 97.5°F | Resp 18 | Ht 66.0 in | Wt 140.0 lb

## 2019-12-30 DIAGNOSIS — E559 Vitamin D deficiency, unspecified: Secondary | ICD-10-CM

## 2019-12-30 DIAGNOSIS — R5381 Other malaise: Secondary | ICD-10-CM

## 2019-12-30 DIAGNOSIS — R5383 Other fatigue: Secondary | ICD-10-CM

## 2019-12-30 DIAGNOSIS — R232 Flushing: Secondary | ICD-10-CM

## 2019-12-30 DIAGNOSIS — F520 Hypoactive sexual desire disorder: Secondary | ICD-10-CM | POA: Diagnosis not present

## 2019-12-30 LAB — ESTRADIOL: Estradiol: 63 pg/mL

## 2019-12-30 LAB — PROGESTERONE: Progesterone: 13.6 ng/mL

## 2019-12-30 LAB — VITAMIN D 25 HYDROXY (VIT D DEFICIENCY, FRACTURES): Vit D, 25-Hydroxy: 74 ng/mL (ref 30–100)

## 2019-12-30 NOTE — Progress Notes (Signed)
Metrics: Intervention Frequency ACO  Documented Smoking Status Yearly  Screened one or more times in 24 months  Cessation Counseling or  Active cessation medication Past 24 months  Past 24 months   Guideline developer: UpToDate (See UpToDate for funding source) Date Released: 2014       Wellness Office Visit  Subjective:  Patient ID: Brittany Obrien, female    DOB: 03-Feb-1971  Age: 49 y.o. MRN: [id number]  CC: This lady comes in for follow-up of bioidentical hormone therapy to help her menopausal symptoms of hot flashes, insomnia. HPI  She had been started on estradiol and progesterone and she does feel that her sleep is better and hot flashes have improved. She has decreased libido which she discussed with me on the very first visit. She has been taking vitamin D3 10,000 units daily for vitamin D deficiency. Past Medical History:  Diagnosis Date  . Abnormal Pap smear   . Gestational diabetes mellitus in pregnancy    Past Surgical History:  Procedure Laterality Date  . WISDOM TOOTH EXTRACTION       Family History  Problem Relation Age of Onset  . Hypertension Father   . Hyperlipidemia Father   . Arthritis Mother   . Stroke Maternal Grandmother   . 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   Social History   Tobacco Use  . Smoking status: Never Smoker  . Smokeless tobacco: Never Used  Substance Use Topics  . Alcohol use: No    Comment: very rare    Current Meds  Medication Sig  . estradiol (ESTRACE) 0.5 MG tablet Take 1 tablet (0.5 mg total) by mouth daily.  . progesterone (PROMETRIUM) 100 MG capsule TAKE 1 CAPSULE BY MOUTH EVERY DAY      Depression screen [facility] 2/9 08/20/2019 12/29/2014  Decreased Interest 0 0  Down, Depressed, Hopeless 0 0  PHQ - 2 Score 0 0     Objective:   Today's Vitals: BP 121/67 (BP Location: Left Arm, Patient  Position: Sitting, Cuff Size: Normal)   Pulse (!) 59   Temp (!) 97.5 F (36.4 C) (Temporal)   Resp 18   Ht 5\' 6"  (1.676 m)   Wt 140 lb (63.5 kg)   SpO2 98%   BMI 22.60 kg/m  Vitals with BMI 12/30/2019 09/29/2019 08/20/2019  Height 5\' 6"  5\' 6"  5\' 6"   Weight 140 lbs 141 lbs 141 lbs 6 oz  BMI 22.61 22.77 22.83  Systolic 121 124 08/22/2019  Diastolic 67 61 65  Pulse 59 70 63     Physical Exam   She looks systemically well.  No new physical findings.    Assessment   1. Vitamin D deficiency disease   2. Hot flashes   3. Hypoactive sexual desire disorder   4. Malaise and fatigue       Tests ordered Orders Placed This Encounter  Procedures  . VITAMIN D 25 Hydroxy (Vit-D Deficiency, Fractures)  . Estradiol  . Progesterone     Plan: 1. We will check vitamin D levels today and she will continue with vitamin D3 10,000 units daily. 2. I will check estradiol and progesterone levels to see if we need to further optimize.  I do not think she feels as well as she would like to yet. 3. We also discussed testosterone therapy, its benefits and side effects. 4. I also went through again  the benefits of biological hormones in general including cardiovascular/cerebrovascular/bone protection. 5. Further recommendations will depend on blood results and I will see her in 3 months time for follow-up.   No orders of the defined types were placed in this encounter.   Wilson Singer, MD

## 2019-12-31 ENCOUNTER — Other Ambulatory Visit (INDEPENDENT_AMBULATORY_CARE_PROVIDER_SITE_OTHER): Payer: Self-pay | Admitting: Internal Medicine

## 2019-12-31 MED ORDER — ESTRADIOL 1 MG PO TABS: 1.0000 mg | ORAL_TABLET | Freq: Every day | ORAL | 3 refills | Status: DC

## 2020-01-29 ENCOUNTER — Other Ambulatory Visit (INDEPENDENT_AMBULATORY_CARE_PROVIDER_SITE_OTHER): Payer: Self-pay | Admitting: Internal Medicine

## 2020-04-05 ENCOUNTER — Ambulatory Visit (INDEPENDENT_AMBULATORY_CARE_PROVIDER_SITE_OTHER): Payer: BC Managed Care – PPO | Admitting: Internal Medicine

## 2020-05-06 ENCOUNTER — Other Ambulatory Visit (INDEPENDENT_AMBULATORY_CARE_PROVIDER_SITE_OTHER): Payer: Self-pay | Admitting: Internal Medicine

## 2020-06-01 ENCOUNTER — Other Ambulatory Visit (INDEPENDENT_AMBULATORY_CARE_PROVIDER_SITE_OTHER): Payer: Self-pay | Admitting: Internal Medicine

## 2020-06-02 ENCOUNTER — Other Ambulatory Visit (INDEPENDENT_AMBULATORY_CARE_PROVIDER_SITE_OTHER): Payer: Self-pay | Admitting: Internal Medicine

## 2020-07-03 ENCOUNTER — Other Ambulatory Visit (INDEPENDENT_AMBULATORY_CARE_PROVIDER_SITE_OTHER): Payer: Self-pay | Admitting: Internal Medicine

## 2020-07-04 ENCOUNTER — Other Ambulatory Visit (INDEPENDENT_AMBULATORY_CARE_PROVIDER_SITE_OTHER): Payer: Self-pay | Admitting: Internal Medicine

## 2020-07-04 MED ORDER — PROGESTERONE MICRONIZED 100 MG PO CAPS: 100.0000 mg | ORAL_CAPSULE | Freq: Every day | ORAL | 0 refills | Status: DC

## 2020-07-04 NOTE — Telephone Encounter (Signed)
Calld;lvm of refill status. Then made a appt to come to see [facility] for follow up.

## 2020-07-05 ENCOUNTER — Encounter: Payer: Self-pay | Admitting: Plastic Surgery

## 2020-07-05 ENCOUNTER — Ambulatory Visit (INDEPENDENT_AMBULATORY_CARE_PROVIDER_SITE_OTHER): Payer: Self-pay | Admitting: Plastic Surgery

## 2020-07-05 ENCOUNTER — Other Ambulatory Visit: Payer: Self-pay

## 2020-07-05 DIAGNOSIS — Z719 Counseling, unspecified: Secondary | ICD-10-CM | POA: Insufficient documentation

## 2020-07-05 NOTE — Progress Notes (Signed)

## 2020-08-18 ENCOUNTER — Ambulatory Visit (INDEPENDENT_AMBULATORY_CARE_PROVIDER_SITE_OTHER): Payer: Self-pay | Admitting: Internal Medicine

## 2020-08-25 ENCOUNTER — Other Ambulatory Visit: Payer: Self-pay

## 2020-08-25 ENCOUNTER — Ambulatory Visit (INDEPENDENT_AMBULATORY_CARE_PROVIDER_SITE_OTHER): Payer: BC Managed Care – PPO | Admitting: Internal Medicine

## 2020-08-25 ENCOUNTER — Encounter (INDEPENDENT_AMBULATORY_CARE_PROVIDER_SITE_OTHER): Payer: Self-pay | Admitting: Internal Medicine

## 2020-08-25 VITALS — BP 122/80 | HR 70 | Temp 97.5°F | Ht 66.0 in | Wt 136.0 lb

## 2020-08-25 DIAGNOSIS — F520 Hypoactive sexual desire disorder: Secondary | ICD-10-CM

## 2020-08-25 DIAGNOSIS — R232 Flushing: Secondary | ICD-10-CM | POA: Diagnosis not present

## 2020-08-25 DIAGNOSIS — G250 Essential tremor: Secondary | ICD-10-CM

## 2020-08-25 MED ORDER — PROGESTERONE MICRONIZED 100 MG PO CAPS: 100.0000 mg | ORAL_CAPSULE | Freq: Every day | ORAL | 1 refills | Status: AC

## 2020-08-25 MED ORDER — ESTRADIOL 1 MG PO TABS: 1.0000 mg | ORAL_TABLET | Freq: Every day | ORAL | 0 refills | Status: AC

## 2020-08-25 NOTE — Progress Notes (Signed)
Metrics: Intervention Frequency ACO  Documented Smoking Status Yearly  Screened one or more times in 24 months  Cessation Counseling or  Active cessation medication Past 24 months  Past 24 months   Guideline developer: UpToDate (See UpToDate for funding source) Date Released: 2014       Wellness Office Visit  Subjective:  Patient ID: Brittany Obrien, female    DOB: January 23, 1971  Age: 50 y.o. MRN: [id number]  CC: This lady comes in after long hiatus.  I last saw her in October 2021. HPI  She ran out of estradiol but has been taking progesterone 100 mg at night. She is getting more hot flashes. She has a Mirena IUD.  Blood work previously done shows that she is in the menopause.  She discussed with her GYN and together they have decided that she should keep the Mirena in for a little longer. She also describes reduced libido. She is concerned about bilateral knee pain especially when she does lunges and squats without any swelling. She is also concerned about shaking of her hands.  She describes her self is high strung. Past Medical History:  Diagnosis Date  . Abnormal Pap smear   . Gestational diabetes mellitus in pregnancy    Past Surgical History:  Procedure Laterality Date  . WISDOM TOOTH EXTRACTION       Family History  Problem Relation Age of Onset  . Hypertension Father   . Hyperlipidemia Father   . Arthritis Mother   . Stroke Maternal Grandmother   . 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   Social History   Tobacco Use  . Smoking status: Never Smoker  . Smokeless tobacco: Never Used  Substance Use Topics  . Alcohol use: No    Comment: very rare    Current Meds  Medication Sig  . clobetasol ointment (TEMOVATE) 0.05 % clobetasol 0.05 % topical ointment  APPLY A THIN LAYER TO THE AFFECTED AREA(S) BY TOPICAL ROUTE at bedtime for 4 weeks  .  levonorgestrel (MIRENA) 20 MCG/24HR IUD 1 each by Intrauterine route once.  . polyethylene glycol (MIRALAX / GLYCOLAX) 17 g packet as needed.  . progesterone (PROMETRIUM) 100 MG capsule Take 1 capsule (100 mg total) by mouth daily.  Marland Kitchen vortioxetine HBr (TRINTELLIX) 20 MG TABS tablet Take 10 mg by mouth daily.  . [DISCONTINUED] estradiol (ESTRACE) 1 MG tablet TAKE 1 TABLET BY MOUTH EVERY DAY  . [DISCONTINUED] progesterone (PROMETRIUM) 100 MG capsule Take 1 capsule (100 mg total) by mouth daily.     Flowsheet Row Office Visit from 08/25/2020 in [facility]  PHQ-9 Total Score 0      Objective:   Today's Vitals: BP 122/80   Pulse 70   Temp (!) 97.5 F (36.4 C) (Temporal)   Ht 5\' 6"  (1.676 m)   Wt 136 lb (61.7 kg)   SpO2 97%   BMI 21.95 kg/m  Vitals with BMI 08/25/2020 07/05/2020 12/30/2019  Height 5\' 6"  5\' 6"  5\' 6"   Weight 136 lbs 140 lbs 140 lbs  BMI 21.96 22.61 22.61  Systolic 122 117 02/29/2020  Diastolic 80 80 67  Pulse 70 59 59     Physical Exam  She looks systemically well.  Blood pressure is excellent.  Finger-to-nose test shows terminal tremor.  She does not have any cerebellar signs.     Assessment   1. Hot flashes  2. Hypoactive sexual desire disorder   3. Benign essential tremor       Tests ordered No orders of the defined types were placed in this encounter.    Plan: 1. We will restart estradiol 1 mg daily and I am going to see if she can tolerate progesterone 200 mg at night.  She will double up on the progesterone 100 mg capsules that she has and let me know if she tolerates this. 2. We discussed testosterone therapy again and its benefits.  We previously had discussed also benefits and side effects and she is not quite sure about testosterone therapy.  I have told her that if she tries it and does not feel that it will benefit her or has side effects, she can always discontinue. 3. I told her that she has benign essential tremor and any caffeine will  aggravate this.  Meditation was also discussed as a way of reducing sympathetic outflow.  If it gets worse, she will let me know and we discussed the use of beta-blockers also. 4. As far as her knee pain is concerned, she probably has osteoarthritis and I recommended vitamin D3, turmeric and possibly DHEA.  She also needs quad strengthening exercises which can be done with leg extensions in the gym.  This will not put weight on the knee joint itself. 5. I will see her in 3 months and we will do blood work then   Meds ordered this encounter  Medications  . estradiol (ESTRACE) 1 MG tablet    Sig: Take 1 tablet (1 mg total) by mouth daily.    Dispense:  90 tablet    Refill:  0    Please asked the patient to call the office and make a follow-up appointment before I can refill further apart from a 30-day supply.  Thank you.  . progesterone (PROMETRIUM) 100 MG capsule    Sig: Take 1 capsule (100 mg total) by mouth daily.    Dispense:  60 capsule    Refill:  1    Amalie Koran Normajean Glasgow, MD

## 2020-12-01 ENCOUNTER — Ambulatory Visit (INDEPENDENT_AMBULATORY_CARE_PROVIDER_SITE_OTHER): Payer: Self-pay | Admitting: Internal Medicine

## 2023-12-12 ENCOUNTER — Other Ambulatory Visit: Payer: Self-pay | Admitting: Obstetrics and Gynecology

## 2023-12-12 DIAGNOSIS — Z1231 Encounter for screening mammogram for malignant neoplasm of breast: Secondary | ICD-10-CM

## 2024-02-04 ENCOUNTER — Ambulatory Visit
Admission: RE | Admit: 2024-02-04 | Discharge: 2024-02-04 | Disposition: A | Discharge status: Home / Self Care | Source: Ambulatory Visit | Attending: Obstetrics and Gynecology | Admitting: Obstetrics and Gynecology

## 2024-02-04 DIAGNOSIS — Z1231 Encounter for screening mammogram for malignant neoplasm of breast: Secondary | ICD-10-CM
